# Patient Record
Sex: Female | Born: 1995 | Race: White | Hispanic: No | Marital: Single | Smoking: Former smoker
Health system: Southern US, Community
[De-identification: ages and names within clinical notes are randomized; demographics above are authoritative.]

## PROBLEM LIST (undated history)

## (undated) DIAGNOSIS — F32A Depression, unspecified: Secondary | ICD-10-CM

## (undated) DIAGNOSIS — F329 Major depressive disorder, single episode, unspecified: Secondary | ICD-10-CM

## (undated) HISTORY — PX: WISDOM TOOTH EXTRACTION: SHX21

---

## 2016-04-14 ENCOUNTER — Emergency Department (HOSPITAL_COMMUNITY)
Admission: EM | Admit: 2016-04-14 | Discharge: 2016-04-15 | Disposition: A | Discharge status: Home / Self Care | Attending: Emergency Medicine | Admitting: Emergency Medicine

## 2016-04-14 ENCOUNTER — Emergency Department (HOSPITAL_COMMUNITY)

## 2016-04-14 ENCOUNTER — Encounter (HOSPITAL_COMMUNITY): Payer: Self-pay | Admitting: Emergency Medicine

## 2016-04-14 ENCOUNTER — Emergency Department (HOSPITAL_COMMUNITY): Admission: EM | Admit: 2016-04-14 | Discharge: 2016-04-14 | Discharge status: Against Medical Advice | Payer: Self-pay

## 2016-04-14 DIAGNOSIS — R1031 Right lower quadrant pain: Secondary | ICD-10-CM | POA: Diagnosis not present

## 2016-04-14 DIAGNOSIS — R109 Unspecified abdominal pain: Secondary | ICD-10-CM

## 2016-04-14 DIAGNOSIS — Z79899 Other long term (current) drug therapy: Secondary | ICD-10-CM | POA: Diagnosis not present

## 2016-04-14 DIAGNOSIS — R1011 Right upper quadrant pain: Secondary | ICD-10-CM | POA: Insufficient documentation

## 2016-04-14 DIAGNOSIS — Z87891 Personal history of nicotine dependence: Secondary | ICD-10-CM | POA: Diagnosis not present

## 2016-04-14 LAB — CBC
HEMATOCRIT: 35.7 % — AB (ref 36.0–46.0)
HEMOGLOBIN: 11.5 g/dL — AB (ref 12.0–15.0)
MCH: 26.9 pg (ref 26.0–34.0)
MCHC: 32.2 g/dL (ref 30.0–36.0)
MCV: 83.4 fL (ref 78.0–100.0)
Platelets: 246 10*3/uL (ref 150–400)
RBC: 4.28 MIL/uL (ref 3.87–5.11)
RDW: 15 % (ref 11.5–15.5)
WBC: 6.6 10*3/uL (ref 4.0–10.5)

## 2016-04-14 LAB — COMPREHENSIVE METABOLIC PANEL
ALBUMIN: 4.8 g/dL (ref 3.5–5.0)
ALK PHOS: 57 U/L (ref 38–126)
ALT: 13 U/L — ABNORMAL LOW (ref 14–54)
ANION GAP: 5 (ref 5–15)
AST: 27 U/L (ref 15–41)
BUN: 8 mg/dL (ref 6–20)
CALCIUM: 9.4 mg/dL (ref 8.9–10.3)
CO2: 26 mmol/L (ref 22–32)
Chloride: 108 mmol/L (ref 101–111)
Creatinine, Ser: 0.68 mg/dL (ref 0.44–1.00)
GFR calc non Af Amer: >60 mL/min (ref >60)
GLUCOSE: 93 mg/dL (ref 65–99)
POTASSIUM: 3.4 mmol/L — AB (ref 3.5–5.1)
SODIUM: 139 mmol/L (ref 135–145)
TOTAL PROTEIN: 7.8 g/dL (ref 6.5–8.1)
Total Bilirubin: 0.7 mg/dL (ref 0.3–1.2)

## 2016-04-14 LAB — I-STAT BETA HCG BLOOD, ED (MC, WL, AP ONLY)

## 2016-04-14 LAB — LIPASE, BLOOD: Lipase: 30 U/L (ref 11–51)

## 2016-04-14 MED ORDER — DICYCLOMINE HCL 20 MG PO TABS: 20.0000 mg | ORAL_TABLET | Freq: Two times a day (BID) | ORAL | Status: AC

## 2016-04-14 NOTE — ED Notes (Signed)
Pt states that she has had abdominal pain since last week on R and L sides. Sent in by UC who did a UA that was neg. N/V. Denies diarrhea. Alert and oriented.

## 2016-04-14 NOTE — ED Provider Notes (Signed)
CSN: [id number]     Arrival date & time 04/14/16  2033 History   First MD Initiated Contact with Patient 04/14/16 2122     Chief Complaint  Patient presents with  . Abdominal Pain     (Consider location/radiation/quality/duration/timing/severity/associated sxs/prior Treatment) Patient is a 20 y.o. female presenting with abdominal pain.  Abdominal Pain Pain location:  RLQ and RUQ Pain quality: aching and sharp   Pain severity:  Mild Duration:  1 week Timing:  Intermittent Progression:  Worsening Chronicity:  New Context: not alcohol use   Associated symptoms: no chills, no constipation, no cough, no diarrhea, no fever, no nausea, no shortness of breath and no vomiting     History reviewed. No pertinent past medical history. Past Surgical History  Procedure Laterality Date  . Wisdom tooth extraction     History reviewed. No pertinent family history. Social History  Substance Use Topics  . Smoking status: Former Games developer  . Smokeless tobacco: None  . Alcohol Use: Yes   OB History    No data available     Review of Systems  Constitutional: Negative for fever and chills.  Respiratory: Negative for cough and shortness of breath.   Gastrointestinal: Positive for abdominal pain. Negative for nausea, vomiting, diarrhea and constipation.  Skin: Negative for pallor, rash and wound.  All other systems reviewed and are negative.     Allergies  Review of patient's allergies indicates no known allergies.  Home Medications   Prior to Admission medications   Medication Sig Start Date End Date Taking? Authorizing Provider  ibuprofen (ADVIL,MOTRIN) 200 MG tablet Take 200 mg by mouth every 6 (six) hours as needed for moderate pain.   Yes Historical Provider, MD  Levonorgestrel (SKYLA) 13.5 MG IUD by Intrauterine route.   Yes Historical Provider, MD   BP 124/81 mmHg  Pulse 66  Temp(Src) 98.3 F (36.8 C) (Oral)  Resp 16  SpO2 100%  LMP 03/29/2016 (Exact Date) Physical Exam   Constitutional: She is oriented to person, place, and time. She appears well-developed and well-nourished.  HENT:  Head: Normocephalic and atraumatic.  Neck: Normal range of motion.  Cardiovascular: Normal rate and regular rhythm.   Pulmonary/Chest: Effort normal and breath sounds normal. No stridor. No respiratory distress. She has no wheezes.  Abdominal: Soft. She exhibits no distension. There is no tenderness.  Musculoskeletal: Normal range of motion.  Neurological: She is alert and oriented to person, place, and time.  Skin: Skin is warm and dry.  Nursing note and vitals reviewed.   ED Course  Procedures (including critical care time) Labs Review Labs Reviewed  COMPREHENSIVE METABOLIC PANEL - Abnormal; Notable for the following:    Potassium 3.4 (*)    ALT 13 (*)    All other components within normal limits  CBC - Abnormal; Notable for the following:    Hemoglobin 11.5 (*)    HCT 35.7 (*)    All other components within normal limits  LIPASE, BLOOD  I-STAT BETA HCG BLOOD, ED ([facility], [facility], [facility] ONLY)    Imaging Review No results found. I have personally reviewed and evaluated these images and lab results as part of my medical decision-making.   EKG Interpretation None      MDM   Final diagnoses:  None    RLQ abdominal pain intermittently worsening. Seen at [facility] and sent here for further eval. Has rlq and ruq ttp, no other findings. Workup negative for appendicitis, cholecystitis, hepatitis or other acute causes for her symptoms.  Will plan to dc and obtain pcp follow up for further workup of her symptoms.   New Prescriptions: Discharge Medication List as of 04/14/2016 11:56 PM    START taking these medications   Details  dicyclomine (BENTYL) 20 MG tablet Take 1 tablet (20 mg total) by mouth 2 (two) times daily., Starting 04/14/2016, Until Discontinued, Print         I have personally and contemperaneously reviewed labs and imaging and used in my decision making as  above.   A medical screening exam was performed and I feel the patient has had an appropriate workup for their chief complaint at this time and likelihood of emergent condition existing is low and thus workup can continue on an outpatient basis.. Their vital signs are stable. They have been counseled on decision, discharge, follow up and which symptoms necessitate immediate return to the emergency department.  They verbally stated understanding and agreement with plan and discharged in stable condition.      Marily MemosJason Kerrington Greenhalgh, MD 04/15/16 1144

## 2016-05-27 ENCOUNTER — Inpatient Hospital Stay (HOSPITAL_COMMUNITY)
Admission: AD | Admit: 2016-05-27 | Discharge: 2016-05-28 | Disposition: A | Discharge status: Home / Self Care | Source: Ambulatory Visit | Attending: Obstetrics and Gynecology | Admitting: Obstetrics and Gynecology

## 2016-05-27 ENCOUNTER — Encounter (HOSPITAL_COMMUNITY): Payer: Self-pay

## 2016-05-27 DIAGNOSIS — R102 Pelvic and perineal pain: Secondary | ICD-10-CM

## 2016-05-27 DIAGNOSIS — Z3202 Encounter for pregnancy test, result negative: Secondary | ICD-10-CM | POA: Insufficient documentation

## 2016-05-27 DIAGNOSIS — Z87891 Personal history of nicotine dependence: Secondary | ICD-10-CM | POA: Insufficient documentation

## 2016-05-27 HISTORY — DX: Depression, unspecified: F32.A

## 2016-05-27 HISTORY — DX: Major depressive disorder, single episode, unspecified: F32.9

## 2016-05-27 NOTE — MAU Note (Signed)
Patient states she got her IUD placed in Feb. Of this year. She states that 3 days ago she had sexual intercourse and felt a sharp cramping pain that hurt so bad she had to stop having sex. Patient states that since then she has felt this pain constantly but sometimes it is worse than other.

## 2016-05-28 ENCOUNTER — Encounter (HOSPITAL_COMMUNITY): Payer: Self-pay | Admitting: *Deleted

## 2016-05-28 DIAGNOSIS — R102 Pelvic and perineal pain: Secondary | ICD-10-CM

## 2016-05-28 LAB — WET PREP, GENITAL
CLUE CELLS WET PREP: NONE SEEN
SPERM: NONE SEEN
TRICH WET PREP: NONE SEEN
Yeast Wet Prep HPF POC: NONE SEEN

## 2016-05-28 LAB — URINE MICROSCOPIC-ADD ON

## 2016-05-28 LAB — GC/CHLAMYDIA PROBE AMP (~~LOC~~) NOT AT ARMC
CHLAMYDIA, DNA PROBE: NEGATIVE
Neisseria Gonorrhea: NEGATIVE

## 2016-05-28 LAB — URINALYSIS, ROUTINE W REFLEX MICROSCOPIC
Bilirubin Urine: NEGATIVE
GLUCOSE, UA: NEGATIVE mg/dL
KETONES UR: NEGATIVE mg/dL
LEUKOCYTES UA: NEGATIVE
Nitrite: NEGATIVE
PH: 5.5 (ref 5.0–8.0)
Protein, ur: NEGATIVE mg/dL
Specific Gravity, Urine: <1.005 — ABNORMAL LOW (ref 1.005–1.030)

## 2016-05-28 LAB — POCT PREGNANCY, URINE: Preg Test, Ur: NEGATIVE

## 2016-05-28 NOTE — Discharge Instructions (Signed)

## 2016-05-28 NOTE — MAU Provider Note (Signed)
History     CSN: [id number]  Arrival date and time: 05/27/16 2323   First Provider Initiated Contact with Patient 05/28/16 0040      Chief Complaint  Patient presents with  . Pelvic Pain   Vicki Long is a 20 y.o. Who presents today with lower abdominal pain. She states that she had an IUD placed in January. She feel the pain is from the IUD.    Pelvic Pain  The patient's primary symptoms include pelvic pain. This is a new problem. The current episode started in the past 7 days. The problem has been unchanged. Pain severity now: 7/10  The problem affects both sides. She is not pregnant. Associated symptoms include abdominal pain and nausea. Pertinent negatives include no chills, constipation, diarrhea, dysuria, fever, frequency, urgency or vomiting. The vaginal discharge was normal. There has been no bleeding. Nothing aggravates the symptoms. She has tried NSAIDs for the symptoms. The treatment provided no relief. She is sexually active. She uses an IUD for contraception. Menstrual history: occasional spotting only with IUD.     Past Medical History:  Diagnosis Date  . Depression     Past Surgical History:  Procedure Laterality Date  . WISDOM TOOTH EXTRACTION      History reviewed. No pertinent family history.  Social History  Substance Use Topics  . Smoking status: Former Games developer  . Smokeless tobacco: Not on file  . Alcohol use Yes    Allergies: No Known Allergies  Prescriptions Prior to Admission  Medication Sig Dispense Refill Last Dose  . dicyclomine (BENTYL) 20 MG tablet Take 1 tablet (20 mg total) by mouth 2 (two) times daily. 20 tablet 0   . ibuprofen (ADVIL,MOTRIN) 200 MG tablet Take 200 mg by mouth every 6 (six) hours as needed for moderate pain.   Past Week at Unknown time  . Levonorgestrel (SKYLA) 13.5 MG IUD by Intrauterine route.   ongoing    Review of Systems  Constitutional: Negative for chills and fever.  Gastrointestinal: Positive for abdominal  pain and nausea. Negative for constipation, diarrhea and vomiting.  Genitourinary: Positive for pelvic pain. Negative for dysuria, frequency and urgency.   Physical Exam   Blood pressure 142/77, pulse 66, temperature 98.6 F (37 C), temperature source Oral, resp. rate 18.  Physical Exam  Nursing note and vitals reviewed. Constitutional: She is oriented to person, place, and time. She appears well-developed and well-nourished. No distress.  HENT:  Head: Normocephalic.  Cardiovascular: Normal rate.   Respiratory: Effort normal.  GI: Soft. There is no tenderness. There is no rebound.  Genitourinary:  Genitourinary Comments:  External: no lesion Vagina: small amount of white discharge Cervix: pink, smooth, no CMT, IUD tail seen at os.  Uterus: NSSC Adnexa: NT   Neurological: She is alert and oriented to person, place, and time.  Skin: Skin is warm and dry.  Psychiatric: She has a normal mood and affect.   Results for orders placed or performed during the hospital encounter of 05/27/16 (from the past 24 hour(s))  Urinalysis, Routine w reflex microscopic (not at [facility])     Status: Abnormal   Collection Time: 05/27/16 11:48 PM  Result Value Ref Range   Color, Urine YELLOW YELLOW   APPearance CLEAR CLEAR   Specific Gravity, Urine <1.005 (L) 1.005 - 1.030   pH 5.5 5.0 - 8.0   Glucose, UA NEGATIVE NEGATIVE mg/dL   Hgb urine dipstick TRACE (A) NEGATIVE   Bilirubin Urine NEGATIVE NEGATIVE   Ketones, ur NEGATIVE  NEGATIVE mg/dL   Protein, ur NEGATIVE NEGATIVE mg/dL   Nitrite NEGATIVE NEGATIVE   Leukocytes, UA NEGATIVE NEGATIVE  Urine microscopic-add on     Status: Abnormal   Collection Time: 05/27/16 11:48 PM  Result Value Ref Range   Squamous Epithelial / LPF 0-5 (A) NONE SEEN   WBC, UA 0-5 0 - 5 WBC/hpf   RBC / HPF 0-5 0 - 5 RBC/hpf   Bacteria, UA FEW (A) NONE SEEN  Pregnancy, urine POC     Status: None   Collection Time: 05/28/16 12:02 AM  Result Value Ref Range   Preg Test,  Ur NEGATIVE NEGATIVE  Wet prep, genital     Status: Abnormal   Collection Time: 05/28/16 12:55 AM  Result Value Ref Range   Yeast Wet Prep HPF POC NONE SEEN NONE SEEN   Trich, Wet Prep NONE SEEN NONE SEEN   Clue Cells Wet Prep HPF POC NONE SEEN NONE SEEN   WBC, Wet Prep HPF POC FEW (A) NONE SEEN   Sperm NONE SEEN     MAU Course  Procedures  MDM   Assessment and Plan   1. Pelvic pain in female    DC home Comfort measures reviewed  Outpatient [place] ordered RX: none  Return to MAU as needed   Follow-up Information    [facility] .   Specialty:  Obstetrics and Gynecology Why:  they will call you with an appointment.  Contact information: [address] [place] [address] [phone]           Tawnya Crook 05/28/2016, 12:45 AM

## 2016-06-02 ENCOUNTER — Ambulatory Visit (HOSPITAL_COMMUNITY): Admission: RE | Admit: 2016-06-02 | Source: Ambulatory Visit

## 2017-08-29 IMAGING — CT CT ABD-PELV W/ CM
2 of 4 series · 16 of 46 positions shown, 18 images · IV contrast (ISOVUE)
Comparison: None.

CLINICAL DATA: Pt states that she has had abdominal pain since last
week on R and L sides. Sent in by UC who did a UA that was neg. N/V.
Denies diarrhea. Alert and oriented.

EXAM:
CT ABDOMEN AND PELVIS WITH CONTRAST
TECHNIQUE: Multidetector CT imaging of the abdomen and pelvis was performed
using the standard protocol following bolus administration of
intravenous contrast.
CONTRAST:  100 mL Isovue 300 IV

[Series 2: abd/pel with · axial · 0.70mm/px · z∈[-423,-8]mm · 13 of 93 slices shown, 15 images]
[im 5/93  soft-tissue]
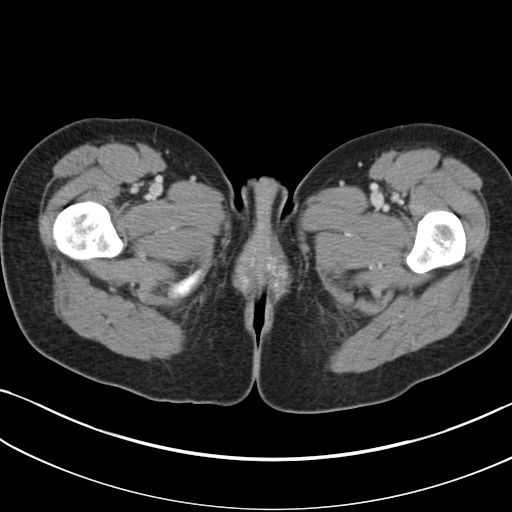
[im 5/93  bone]
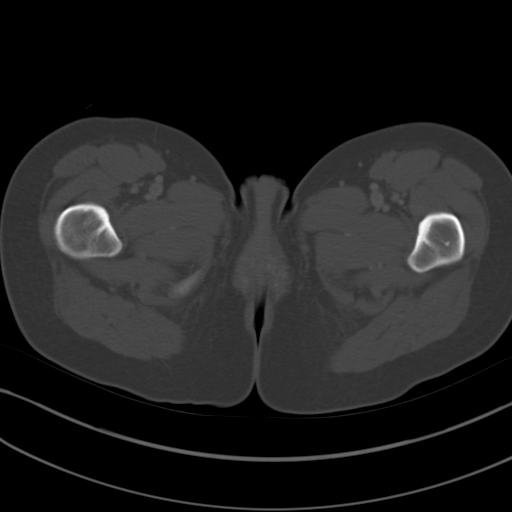
[im 14/93  soft-tissue]
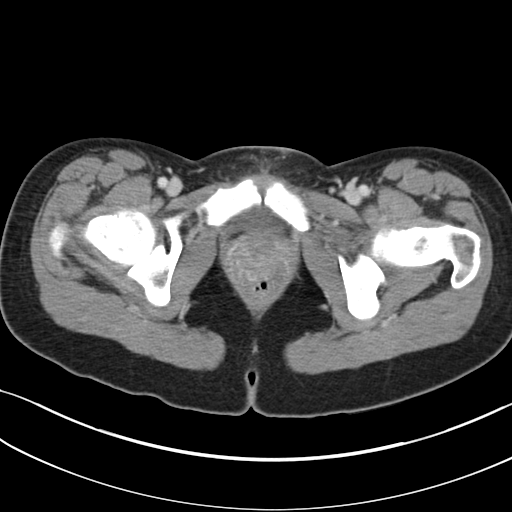
[im 19/93  soft-tissue]
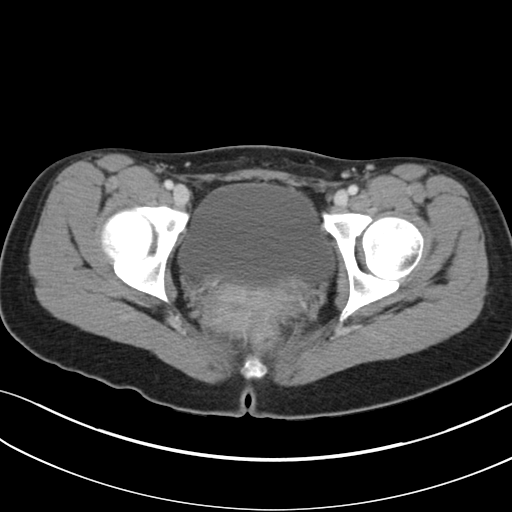
[im 28/93  soft-tissue]
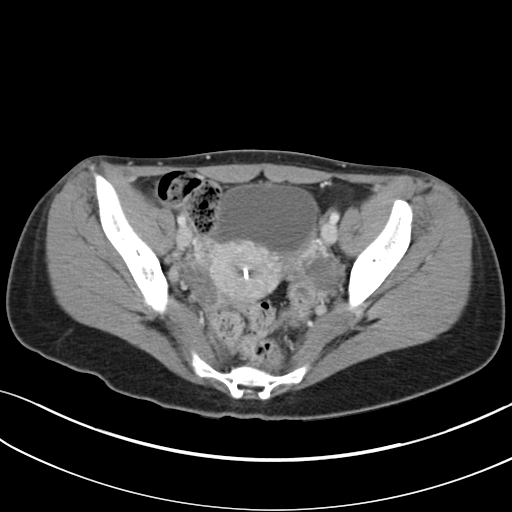
[im 33/93  soft-tissue]
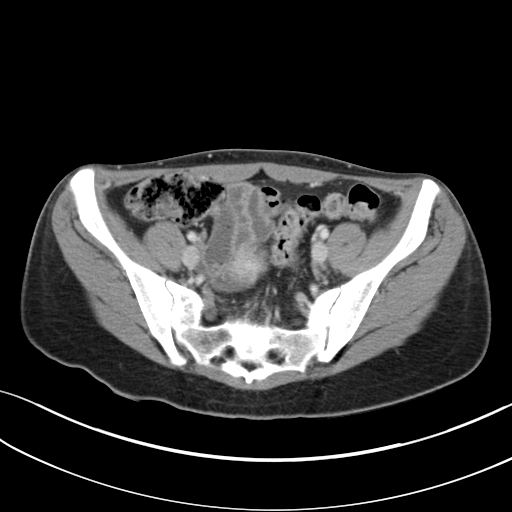
[im 42/93  soft-tissue]
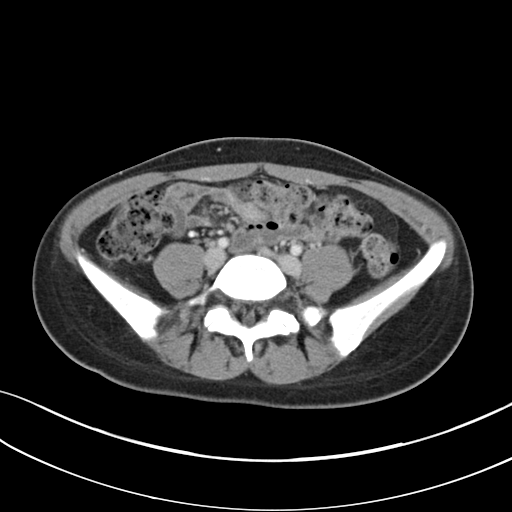
[im 47/93  soft-tissue]
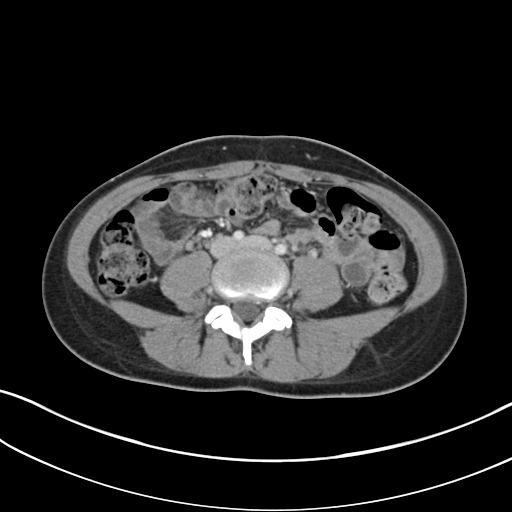
[im 51/93  soft-tissue]
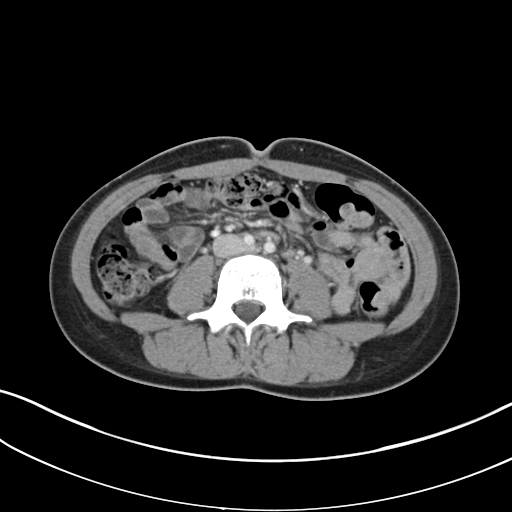
[im 60/93  soft-tissue]
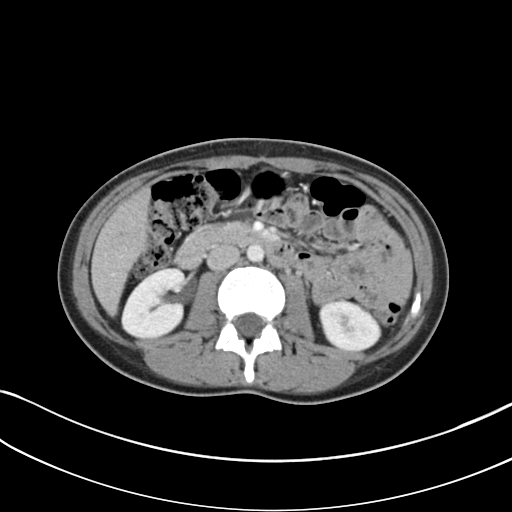
[im 60/93  bone]
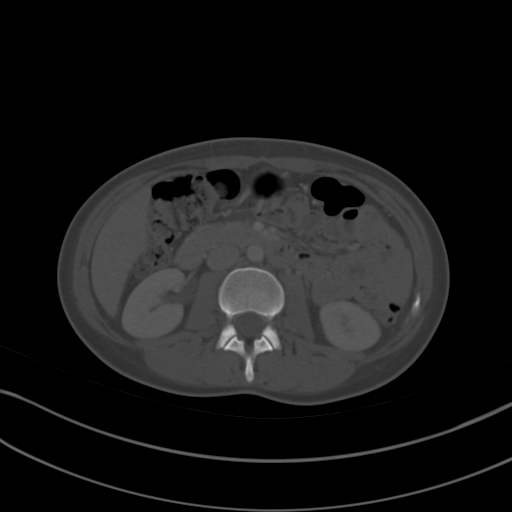
[im 65/93  soft-tissue]
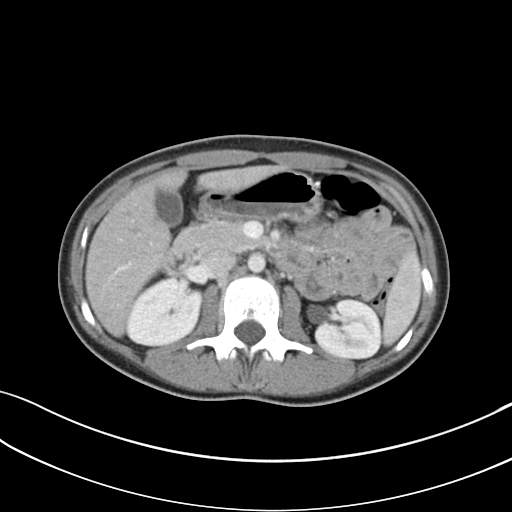
[im 74/93  soft-tissue]
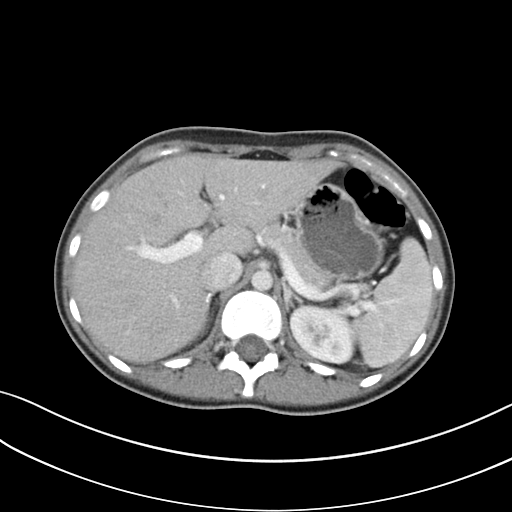
[im 79/93  soft-tissue]
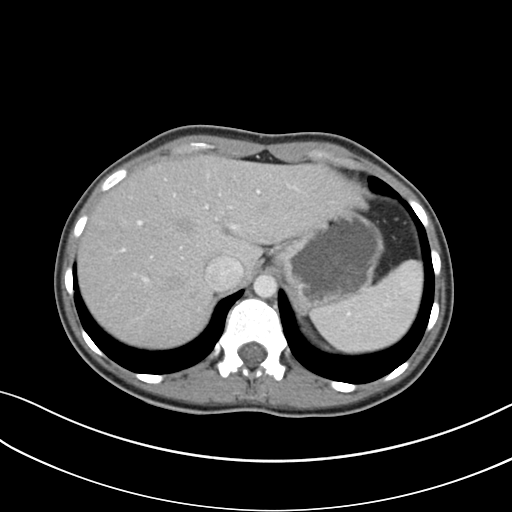
[im 88/93  soft-tissue]
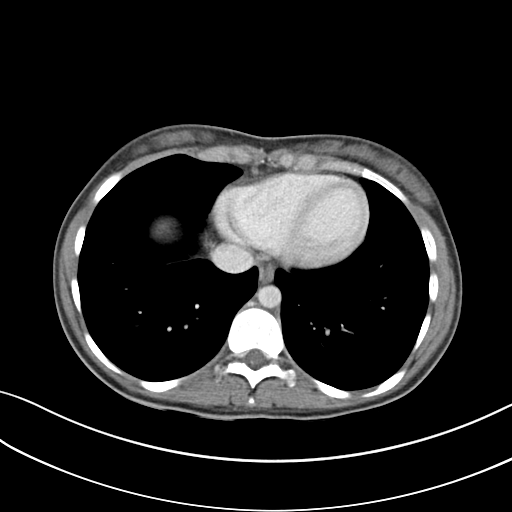

[Series 5: coronal a/|p · coronal · 0.61mm/px · 3 of 101 slices shown]
[im 34/101  soft-tissue]
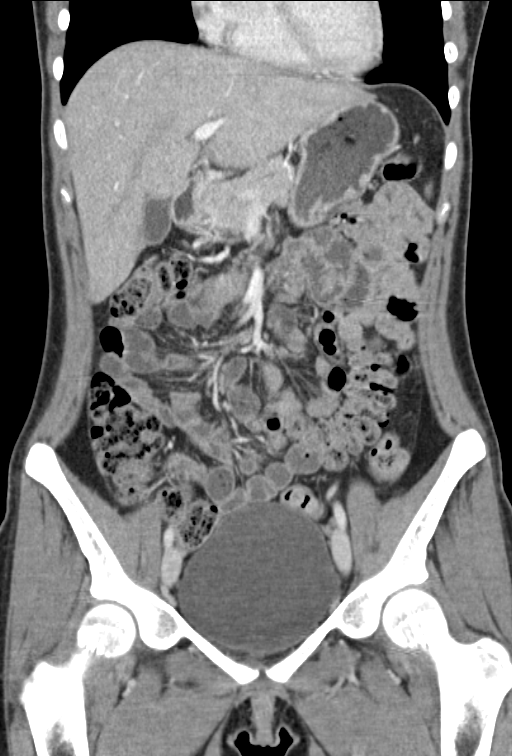
[im 45/101  soft-tissue]
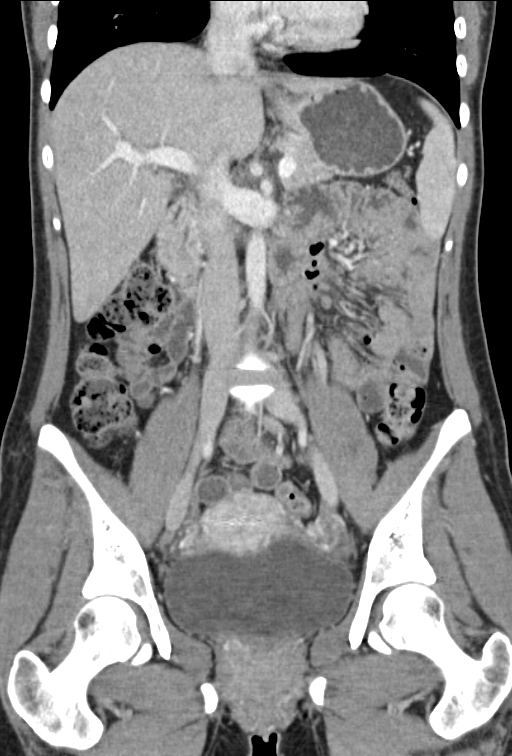
[im 56/101  soft-tissue]
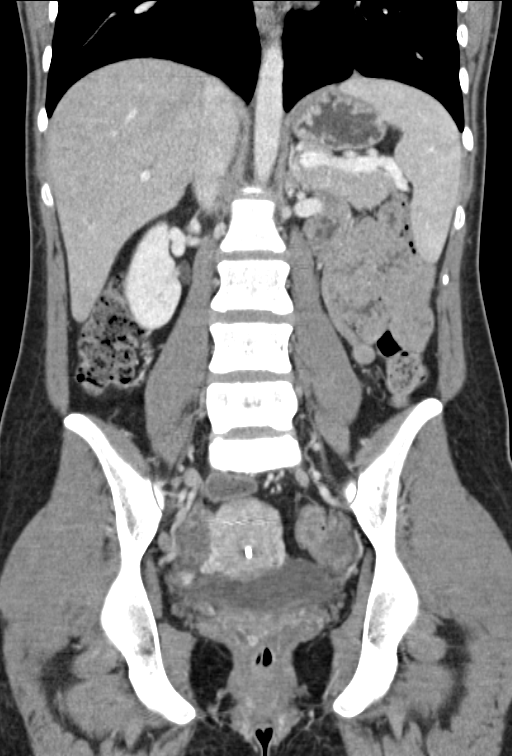

[16 of 46 positions shown; findings below may reference images not displayed]

FINDINGS: Lower chest:  No acute findings.

Hepatobiliary: No masses or other significant abnormality.

Pancreas: No mass, inflammatory changes, or other significant
abnormality.

Spleen: Within normal limits in size and appearance.

Adrenals/Urinary Tract: Coarse calcifications up to 1 cm in the
right adrenal gland which is otherwise small. Left adrenal
unremarkable. No renal mass or hydronephrosis. No urolithiasis.
Urinary bladder physiologically distended.

Stomach/Bowel: No evidence of obstruction, inflammatory process, or
abnormal fluid collections. Appendix not discretely identified. No
pericecal inflammatory changes however.

Vascular/Lymphatic: No pathologically enlarged lymph nodes. No
evidence of abdominal aortic aneurysm. Portal vein patent.

Reproductive: No mass or other significant abnormality. IUD in
expected location.

Other: Small amount of free fluid in the cul-de-sac, which can be
normal in menstruating females. No other abdominal ascites. No free
air.

Musculoskeletal:  No suspicious bone lesions identified.
IMPRESSION: 1. No acute abdominal process.
2. Coarse right adrenal calcifications, often associated with prior
hemorrhage.
